# Patient Record
Sex: Female | Born: 1955
Health system: Southern US, Community
[De-identification: ages and names within clinical notes are randomized; demographics above are authoritative.]

## PROBLEM LIST (undated history)

## (undated) DIAGNOSIS — I10 Essential (primary) hypertension: Secondary | ICD-10-CM

## (undated) HISTORY — PX: BACK SURGERY: SHX140

---

## 2006-08-29 ENCOUNTER — Encounter: Admission: RE | Admit: 2006-08-29 | Discharge: 2006-08-29 | Payer: Self-pay | Admitting: Family Medicine

## 2007-08-07 ENCOUNTER — Other Ambulatory Visit: Admission: RE | Admit: 2007-08-07 | Discharge: 2007-08-07 | Payer: Self-pay | Admitting: Family Medicine

## 2008-08-30 ENCOUNTER — Other Ambulatory Visit: Admission: RE | Admit: 2008-08-30 | Discharge: 2008-08-30 | Payer: Self-pay | Admitting: Family Medicine

## 2012-11-21 ENCOUNTER — Other Ambulatory Visit (HOSPITAL_COMMUNITY)
Admission: RE | Admit: 2012-11-21 | Discharge: 2012-11-21 | Disposition: A | Discharge status: Home / Self Care | Payer: BC Managed Care – PPO | Source: Ambulatory Visit | Attending: Obstetrics and Gynecology | Admitting: Obstetrics and Gynecology

## 2012-11-21 ENCOUNTER — Other Ambulatory Visit: Payer: Self-pay | Admitting: Obstetrics and Gynecology

## 2012-11-21 DIAGNOSIS — Z01419 Encounter for gynecological examination (general) (routine) without abnormal findings: Secondary | ICD-10-CM | POA: Insufficient documentation

## 2012-11-21 DIAGNOSIS — Z1151 Encounter for screening for human papillomavirus (HPV): Secondary | ICD-10-CM | POA: Insufficient documentation

## 2015-08-05 DIAGNOSIS — E119 Type 2 diabetes mellitus without complications: Secondary | ICD-10-CM | POA: Diagnosis not present

## 2015-08-05 DIAGNOSIS — M545 Low back pain: Secondary | ICD-10-CM | POA: Diagnosis not present

## 2016-02-03 DIAGNOSIS — E785 Hyperlipidemia, unspecified: Secondary | ICD-10-CM | POA: Diagnosis not present

## 2016-02-03 DIAGNOSIS — E119 Type 2 diabetes mellitus without complications: Secondary | ICD-10-CM | POA: Diagnosis not present

## 2016-02-03 DIAGNOSIS — I10 Essential (primary) hypertension: Secondary | ICD-10-CM | POA: Diagnosis not present

## 2016-03-08 ENCOUNTER — Other Ambulatory Visit: Payer: Self-pay | Admitting: Family Medicine

## 2016-04-21 DIAGNOSIS — Z1231 Encounter for screening mammogram for malignant neoplasm of breast: Secondary | ICD-10-CM | POA: Diagnosis not present

## 2016-07-01 DIAGNOSIS — S0500XA Injury of conjunctiva and corneal abrasion without foreign body, unspecified eye, initial encounter: Secondary | ICD-10-CM | POA: Diagnosis not present

## 2016-08-24 DIAGNOSIS — M159 Polyosteoarthritis, unspecified: Secondary | ICD-10-CM | POA: Diagnosis not present

## 2016-08-24 DIAGNOSIS — E785 Hyperlipidemia, unspecified: Secondary | ICD-10-CM | POA: Diagnosis not present

## 2016-08-24 DIAGNOSIS — I10 Essential (primary) hypertension: Secondary | ICD-10-CM | POA: Diagnosis not present

## 2016-08-24 DIAGNOSIS — E119 Type 2 diabetes mellitus without complications: Secondary | ICD-10-CM | POA: Diagnosis not present

## 2017-03-21 DIAGNOSIS — Z1231 Encounter for screening mammogram for malignant neoplasm of breast: Secondary | ICD-10-CM | POA: Diagnosis not present

## 2017-04-03 ENCOUNTER — Emergency Department (HOSPITAL_COMMUNITY)
Admission: EM | Admit: 2017-04-03 | Discharge: 2017-04-03 | Disposition: A | Discharge status: Home / Self Care | Payer: BLUE CROSS/BLUE SHIELD | Attending: Emergency Medicine | Admitting: Emergency Medicine

## 2017-04-03 ENCOUNTER — Other Ambulatory Visit: Payer: Self-pay

## 2017-04-03 ENCOUNTER — Encounter (HOSPITAL_COMMUNITY): Payer: Self-pay | Admitting: Emergency Medicine

## 2017-04-03 ENCOUNTER — Emergency Department (HOSPITAL_COMMUNITY): Payer: BLUE CROSS/BLUE SHIELD

## 2017-04-03 DIAGNOSIS — Y93E1 Activity, personal bathing and showering: Secondary | ICD-10-CM | POA: Diagnosis not present

## 2017-04-03 DIAGNOSIS — S52611A Displaced fracture of right ulna styloid process, initial encounter for closed fracture: Secondary | ICD-10-CM | POA: Diagnosis not present

## 2017-04-03 DIAGNOSIS — Y92002 Bathroom of unspecified non-institutional (private) residence single-family (private) house as the place of occurrence of the external cause: Secondary | ICD-10-CM | POA: Insufficient documentation

## 2017-04-03 DIAGNOSIS — Y999 Unspecified external cause status: Secondary | ICD-10-CM | POA: Diagnosis not present

## 2017-04-03 DIAGNOSIS — I10 Essential (primary) hypertension: Secondary | ICD-10-CM | POA: Diagnosis not present

## 2017-04-03 DIAGNOSIS — T148XXA Other injury of unspecified body region, initial encounter: Secondary | ICD-10-CM | POA: Diagnosis not present

## 2017-04-03 DIAGNOSIS — W010XXA Fall on same level from slipping, tripping and stumbling without subsequent striking against object, initial encounter: Secondary | ICD-10-CM | POA: Insufficient documentation

## 2017-04-03 DIAGNOSIS — S62101A Fracture of unspecified carpal bone, right wrist, initial encounter for closed fracture: Secondary | ICD-10-CM

## 2017-04-03 DIAGNOSIS — S52511A Displaced fracture of right radial styloid process, initial encounter for closed fracture: Secondary | ICD-10-CM | POA: Diagnosis not present

## 2017-04-03 DIAGNOSIS — S52351A Displaced comminuted fracture of shaft of radius, right arm, initial encounter for closed fracture: Secondary | ICD-10-CM | POA: Diagnosis not present

## 2017-04-03 DIAGNOSIS — S299XXA Unspecified injury of thorax, initial encounter: Secondary | ICD-10-CM | POA: Diagnosis not present

## 2017-04-03 DIAGNOSIS — R079 Chest pain, unspecified: Secondary | ICD-10-CM | POA: Diagnosis not present

## 2017-04-03 DIAGNOSIS — W19XXXA Unspecified fall, initial encounter: Secondary | ICD-10-CM

## 2017-04-03 DIAGNOSIS — M546 Pain in thoracic spine: Secondary | ICD-10-CM | POA: Diagnosis not present

## 2017-04-03 DIAGNOSIS — S6991XA Unspecified injury of right wrist, hand and finger(s), initial encounter: Secondary | ICD-10-CM | POA: Diagnosis not present

## 2017-04-03 DIAGNOSIS — S52501A Unspecified fracture of the lower end of right radius, initial encounter for closed fracture: Secondary | ICD-10-CM | POA: Diagnosis not present

## 2017-04-03 HISTORY — DX: Essential (primary) hypertension: I10

## 2017-04-03 LAB — BASIC METABOLIC PANEL
Anion gap: 12 (ref 5–15)
BUN: 14 mg/dL (ref 6–20)
CALCIUM: 9.2 mg/dL (ref 8.9–10.3)
CO2: 22 mmol/L (ref 22–32)
Chloride: 104 mmol/L (ref 101–111)
Creatinine, Ser: 0.76 mg/dL (ref 0.44–1.00)
GFR calc non Af Amer: >60 mL/min (ref >60)
Glucose, Bld: 126 mg/dL — ABNORMAL HIGH (ref 65–99)
Potassium: 4.1 mmol/L (ref 3.5–5.1)
SODIUM: 138 mmol/L (ref 135–145)

## 2017-04-03 LAB — CBC
HCT: 45.3 % (ref 36.0–46.0)
Hemoglobin: 14.9 g/dL (ref 12.0–15.0)
MCH: 30.9 pg (ref 26.0–34.0)
MCHC: 32.9 g/dL (ref 30.0–36.0)
MCV: 94 fL (ref 78.0–100.0)
PLATELETS: 218 10*3/uL (ref 150–400)
RBC: 4.82 MIL/uL (ref 3.87–5.11)
RDW: 13.5 % (ref 11.5–15.5)
WBC: 13.9 10*3/uL — AB (ref 4.0–10.5)

## 2017-04-03 MED ORDER — IBUPROFEN 800 MG PO TABS: 800.0000 mg | ORAL_TABLET | Freq: Three times a day (TID) | ORAL | 0 refills | Status: AC

## 2017-04-03 MED ORDER — ORPHENADRINE CITRATE ER 100 MG PO TB12: 100.0000 mg | ORAL_TABLET | Freq: Two times a day (BID) | ORAL | 0 refills | Status: AC

## 2017-04-03 MED ORDER — IBUPROFEN 800 MG PO TABS
800.0000 mg | ORAL_TABLET | Freq: Once | ORAL | Status: AC
  Administered 2017-04-03: 800 mg via ORAL
  Filled 2017-04-03: qty 1

## 2017-04-03 NOTE — Discharge Instructions (Addendum)
1.  Keep your wrist elevated as much as possible for the next 2 days.  While well wrapped ice pack.  Do not allow your splint to get wet. 2.  Make an appointment for evaluation by the orthopedic doctor as soon as possible.

## 2017-04-03 NOTE — ED Notes (Signed)
Ortho tech paged for splint.

## 2017-04-03 NOTE — Progress Notes (Signed)
Orthopedic Tech Progress Note Patient Details:  Roma KayserBeth Avakian [birth year] [id number]  Ortho Devices Type of Ortho Device: Ace wrap, Arm sling, Volar splint, Thumb spica splint Splint Material: Plaster Ortho Device/Splint Location: rue Ortho Device/Splint Interventions: Application   Post Interventions Patient Tolerated: Well Instructions Provided: Care of device   Brinleigh Tew 04/03/2017, 1:10 PM

## 2017-04-03 NOTE — ED Provider Notes (Signed)
[facility] EMERGENCY DEPARTMENT Provider Note   CSN: [id number] Arrival date & time: 04/03/17  1030     History   Chief Complaint Chief Complaint  Patient presents with  . Fall    HPI Erica Gross is a 62 y.o. female.  HPI Patient reports she was coming out of the shower when she slipped and fell.  She landed on her right wrist.  She reports she has severe pain in the wrist.  She denies she hit her head.  She denies headache or loss of consciousness.  No neck pain.  No chest pain.  Reports her low back is somewhat more uncomfortable than baseline.  She reports she however has problems with scoliosis and low back pain anyways.  She reports that she was able to stand and walk to let the medics come in.  She denies she had severe hip or lower extremity pain. Past Medical History:  Diagnosis Date  . Hypertension     There are no active problems to display for this patient.   Past Surgical History:  Procedure Laterality Date  . BACK SURGERY       OB History   None      Home Medications    Prior to Admission medications   Medication Sig Start Date End Date Taking? Authorizing Provider  ibuprofen (ADVIL,MOTRIN) 800 MG tablet Take 1 tablet (800 mg total) by mouth 3 (three) times daily. 04/03/17   Arby Barrette, MD  orphenadrine (NORFLEX) 100 MG tablet Take 1 tablet (100 mg total) by mouth 2 (two) times daily. 04/03/17   Arby Barrette, MD    Family History No family history on file.  Social History Social History   Tobacco Use  . Smoking status: Never Smoker  . Smokeless tobacco: Never Used  Substance Use Topics  . Alcohol use: Never    Frequency: Never  . Drug use: Never     Allergies   Azithromycin and Penicillins   Review of Systems Review of Systems  10 Systems reviewed and are negative for acute change except as noted in the HPI.  Physical Exam Updated Vital Signs BP 137/75   Pulse 99   Temp 97.9 F (36.6 C) (Oral)    Resp (!) 22   Ht 5\' 7"  (1.702 m)   Wt 88 kg (194 lb)   SpO2 (!) 89%   BMI 30.38 kg/m   Physical Exam  Constitutional: She is oriented to person, place, and time. She appears well-developed and well-nourished. No distress.  HENT:  Head: Normocephalic and atraumatic.  Right Ear: External ear normal.  Left Ear: External ear normal.  Nose: Nose normal.  Mouth/Throat: Oropharynx is clear and moist.  Eyes: Pupils are equal, round, and reactive to light. Conjunctivae and EOM are normal.  Neck: Neck supple.  Cardiovascular: Normal rate and regular rhythm.  No murmur heard. Pulmonary/Chest: Effort normal and breath sounds normal. No respiratory distress.  Abdominal: Soft. There is no tenderness.  Musculoskeletal: She exhibits deformity. She exhibits no edema.  Right wrist with swelling and mild deformity.  Hand is warm and dry and neurovascularly intact.  All other extremities normal range of motion.  Neurological: She is alert and oriented to person, place, and time. No cranial nerve deficit. She exhibits normal muscle tone. Coordination normal.  Skin: Skin is warm and dry.  Psychiatric: She has a normal mood and affect.  Nursing note and vitals reviewed.    ED Treatments / Results  Labs (all labs ordered  are listed, but only abnormal results are displayed) Labs Reviewed  BASIC METABOLIC PANEL - Abnormal; Notable for the following components:      Result Value   Glucose, Bld 126 (*)    All other components within normal limits  CBC - Abnormal; Notable for the following components:   WBC 13.9 (*)    All other components within normal limits    EKG None  Radiology Dg Chest 2 View  Result Date: 04/03/2017 CLINICAL DATA:  Patient fell a home.  Pain. EXAM: CHEST - 2 VIEW COMPARISON:  None. FINDINGS: Scoliotic curvature of the thoracic spine. The heart, hila, mediastinum, lungs, and pleura are unremarkable. IMPRESSION: No active cardiopulmonary disease. Electronically Signed   By:  Gerome Samavid  Williams III M.D   On: 04/03/2017 11:41   Dg Forearm Right  Result Date: 04/03/2017 CLINICAL DATA:  Pain. EXAM: RIGHT FOREARM - 2 VIEW COMPARISON:  None. FINDINGS: There is a comminuted displaced fracture of the distal radius extending into the radiocarpal joint. There is an ulnar styloid tip fracture better seen on the wrist films. Proximal radius and ulna are intact. IMPRESSION: Distal radius and ulnar styloid tip fractures as above. Electronically Signed   By: Gerome Samavid  Williams III M.D   On: 04/03/2017 11:47   Dg Wrist Complete Right  Result Date: 04/03/2017 CLINICAL DATA:  Pain after fall EXAM: RIGHT WRIST - COMPLETE 3+ VIEW COMPARISON:  None. FINDINGS: There is a comminuted displaced fracture of the distal radius extending into the radiocarpal joint. A calcification distal to the ulna is likely due to a fracture off the ulnar styloid tip. No dislocation. IMPRESSION: Comminuted displaced fracture of the distal radius extending into the radiocarpal joint. Fracture of the distal ulnar styloid tip. Electronically Signed   By: Gerome Samavid  Williams III M.D   On: 04/03/2017 11:43    Procedures Procedures (including critical care time)  Medications Ordered in ED Medications  ibuprofen (ADVIL,MOTRIN) tablet 800 mg (800 mg Oral Given 04/03/17 1216)     Initial Impression / Assessment and Plan / ED Course  I have reviewed the triage vital signs and the nursing notes.  Pertinent labs & imaging results that were available during my care of the patient were reviewed by me and considered in my medical decision making (see chart for details).     Final Clinical Impressions(s) / ED Diagnoses   Final diagnoses:  Closed fracture of right wrist, initial encounter  Fall, initial encounter  Patient fell with slipping from her shower.  Isolated injury to her right wrist.  Patient is neurovascularly intact.  No head injury, chest injury, neck injury.  Patient was ambulatory without difficulty.  She was  splinted and was given instructions for icing and elevating.  Patient reports that the best pain medication for her ibuprofen.  Does not wish other narcotic pain control.  Patient instructed to contact orthopedics for follow-up next week.  ED Discharge Orders        Ordered    ibuprofen (ADVIL,MOTRIN) 800 MG tablet  3 times daily     04/03/17 1241    orphenadrine (NORFLEX) 100 MG tablet  2 times daily     04/03/17 1241       Arby BarrettePfeiffer, Tabbatha Bordelon, MD 04/03/17 1246

## 2017-04-03 NOTE — ED Triage Notes (Signed)
Patient from home after a fall, patient states she slipped when getting out of the shower. Denies hitting her head, denies LOC. Braced her self with arms extended, pain to right wrist. EMS reports deformity, arrives splinted, pain 3/10 without medication, PMS intact distal to injury. 20g saline lock in left hand. EMS reports patient found to be in afib RVR with rate 130 on scene, patient denies history of afib, patient denies feeling anything abnormal other than pain from the fall. EMS reports patient converted spontaneously en route to normal sinus. Patient alert and oriented at this time and in no apparent distress. NSR on monitor.

## 2017-04-06 DIAGNOSIS — M25551 Pain in right hip: Secondary | ICD-10-CM | POA: Diagnosis not present

## 2017-04-06 DIAGNOSIS — S52501A Unspecified fracture of the lower end of right radius, initial encounter for closed fracture: Secondary | ICD-10-CM | POA: Diagnosis not present

## 2017-04-06 DIAGNOSIS — M545 Low back pain: Secondary | ICD-10-CM | POA: Diagnosis not present

## 2017-04-06 DIAGNOSIS — M79641 Pain in right hand: Secondary | ICD-10-CM | POA: Diagnosis not present

## 2017-04-07 DIAGNOSIS — M25551 Pain in right hip: Secondary | ICD-10-CM | POA: Diagnosis not present

## 2017-04-07 DIAGNOSIS — M545 Low back pain: Secondary | ICD-10-CM | POA: Diagnosis not present

## 2017-04-11 DIAGNOSIS — M5136 Other intervertebral disc degeneration, lumbar region: Secondary | ICD-10-CM | POA: Diagnosis not present

## 2017-04-11 DIAGNOSIS — S329XXA Fracture of unspecified parts of lumbosacral spine and pelvis, initial encounter for closed fracture: Secondary | ICD-10-CM | POA: Diagnosis not present

## 2017-04-11 DIAGNOSIS — S32011A Stable burst fracture of first lumbar vertebra, initial encounter for closed fracture: Secondary | ICD-10-CM | POA: Diagnosis not present

## 2017-04-11 DIAGNOSIS — S3210XA Unspecified fracture of sacrum, initial encounter for closed fracture: Secondary | ICD-10-CM | POA: Diagnosis not present

## 2017-04-12 DIAGNOSIS — G8918 Other acute postprocedural pain: Secondary | ICD-10-CM | POA: Diagnosis not present

## 2017-04-12 DIAGNOSIS — Y999 Unspecified external cause status: Secondary | ICD-10-CM | POA: Diagnosis not present

## 2017-04-12 DIAGNOSIS — S52571A Other intraarticular fracture of lower end of right radius, initial encounter for closed fracture: Secondary | ICD-10-CM | POA: Diagnosis not present

## 2017-04-12 DIAGNOSIS — X58XXXA Exposure to other specified factors, initial encounter: Secondary | ICD-10-CM | POA: Diagnosis not present

## 2017-04-25 DIAGNOSIS — S52501A Unspecified fracture of the lower end of right radius, initial encounter for closed fracture: Secondary | ICD-10-CM | POA: Diagnosis not present

## 2017-04-25 DIAGNOSIS — M810 Age-related osteoporosis without current pathological fracture: Secondary | ICD-10-CM | POA: Diagnosis not present

## 2017-04-25 DIAGNOSIS — S3210XA Unspecified fracture of sacrum, initial encounter for closed fracture: Secondary | ICD-10-CM | POA: Diagnosis not present

## 2017-04-25 DIAGNOSIS — S52501D Unspecified fracture of the lower end of right radius, subsequent encounter for closed fracture with routine healing: Secondary | ICD-10-CM | POA: Diagnosis not present

## 2017-04-25 DIAGNOSIS — S32011A Stable burst fracture of first lumbar vertebra, initial encounter for closed fracture: Secondary | ICD-10-CM | POA: Diagnosis not present

## 2017-04-25 DIAGNOSIS — S329XXA Fracture of unspecified parts of lumbosacral spine and pelvis, initial encounter for closed fracture: Secondary | ICD-10-CM | POA: Diagnosis not present

## 2017-05-09 DIAGNOSIS — S52501D Unspecified fracture of the lower end of right radius, subsequent encounter for closed fracture with routine healing: Secondary | ICD-10-CM | POA: Diagnosis not present

## 2017-05-09 DIAGNOSIS — M25531 Pain in right wrist: Secondary | ICD-10-CM | POA: Diagnosis not present

## 2017-05-16 DIAGNOSIS — S3210XA Unspecified fracture of sacrum, initial encounter for closed fracture: Secondary | ICD-10-CM | POA: Diagnosis not present

## 2017-05-16 DIAGNOSIS — S32011A Stable burst fracture of first lumbar vertebra, initial encounter for closed fracture: Secondary | ICD-10-CM | POA: Diagnosis not present

## 2017-05-16 DIAGNOSIS — M5136 Other intervertebral disc degeneration, lumbar region: Secondary | ICD-10-CM | POA: Diagnosis not present

## 2017-05-16 DIAGNOSIS — S329XXA Fracture of unspecified parts of lumbosacral spine and pelvis, initial encounter for closed fracture: Secondary | ICD-10-CM | POA: Diagnosis not present

## 2017-05-17 DIAGNOSIS — M25631 Stiffness of right wrist, not elsewhere classified: Secondary | ICD-10-CM | POA: Diagnosis not present

## 2017-05-20 DIAGNOSIS — S32011D Stable burst fracture of first lumbar vertebra, subsequent encounter for fracture with routine healing: Secondary | ICD-10-CM | POA: Diagnosis not present

## 2017-05-20 DIAGNOSIS — M25631 Stiffness of right wrist, not elsewhere classified: Secondary | ICD-10-CM | POA: Diagnosis not present

## 2017-05-24 DIAGNOSIS — S32011D Stable burst fracture of first lumbar vertebra, subsequent encounter for fracture with routine healing: Secondary | ICD-10-CM | POA: Diagnosis not present

## 2017-05-24 DIAGNOSIS — M25631 Stiffness of right wrist, not elsewhere classified: Secondary | ICD-10-CM | POA: Diagnosis not present

## 2017-05-27 DIAGNOSIS — M25631 Stiffness of right wrist, not elsewhere classified: Secondary | ICD-10-CM | POA: Diagnosis not present

## 2017-05-27 DIAGNOSIS — S32011D Stable burst fracture of first lumbar vertebra, subsequent encounter for fracture with routine healing: Secondary | ICD-10-CM | POA: Diagnosis not present

## 2017-05-30 DIAGNOSIS — S32011D Stable burst fracture of first lumbar vertebra, subsequent encounter for fracture with routine healing: Secondary | ICD-10-CM | POA: Diagnosis not present

## 2017-05-30 DIAGNOSIS — M25631 Stiffness of right wrist, not elsewhere classified: Secondary | ICD-10-CM | POA: Diagnosis not present

## 2017-05-30 DIAGNOSIS — S52501D Unspecified fracture of the lower end of right radius, subsequent encounter for closed fracture with routine healing: Secondary | ICD-10-CM | POA: Diagnosis not present

## 2017-06-03 DIAGNOSIS — S32011D Stable burst fracture of first lumbar vertebra, subsequent encounter for fracture with routine healing: Secondary | ICD-10-CM | POA: Diagnosis not present

## 2017-06-03 DIAGNOSIS — M25631 Stiffness of right wrist, not elsewhere classified: Secondary | ICD-10-CM | POA: Diagnosis not present

## 2017-06-07 DIAGNOSIS — M25631 Stiffness of right wrist, not elsewhere classified: Secondary | ICD-10-CM | POA: Diagnosis not present

## 2017-06-10 DIAGNOSIS — S32011D Stable burst fracture of first lumbar vertebra, subsequent encounter for fracture with routine healing: Secondary | ICD-10-CM | POA: Diagnosis not present

## 2017-06-10 DIAGNOSIS — M25631 Stiffness of right wrist, not elsewhere classified: Secondary | ICD-10-CM | POA: Diagnosis not present

## 2017-06-13 DIAGNOSIS — M810 Age-related osteoporosis without current pathological fracture: Secondary | ICD-10-CM | POA: Diagnosis not present

## 2017-06-13 DIAGNOSIS — S32011A Stable burst fracture of first lumbar vertebra, initial encounter for closed fracture: Secondary | ICD-10-CM | POA: Diagnosis not present

## 2017-06-13 DIAGNOSIS — S3210XA Unspecified fracture of sacrum, initial encounter for closed fracture: Secondary | ICD-10-CM | POA: Diagnosis not present

## 2017-06-13 DIAGNOSIS — S52501D Unspecified fracture of the lower end of right radius, subsequent encounter for closed fracture with routine healing: Secondary | ICD-10-CM | POA: Diagnosis not present

## 2017-06-14 DIAGNOSIS — M25631 Stiffness of right wrist, not elsewhere classified: Secondary | ICD-10-CM | POA: Diagnosis not present

## 2017-06-16 DIAGNOSIS — M25631 Stiffness of right wrist, not elsewhere classified: Secondary | ICD-10-CM | POA: Diagnosis not present

## 2017-06-20 DIAGNOSIS — M25631 Stiffness of right wrist, not elsewhere classified: Secondary | ICD-10-CM | POA: Diagnosis not present

## 2017-06-24 DIAGNOSIS — M25631 Stiffness of right wrist, not elsewhere classified: Secondary | ICD-10-CM | POA: Diagnosis not present

## 2017-06-27 DIAGNOSIS — Z4789 Encounter for other orthopedic aftercare: Secondary | ICD-10-CM | POA: Diagnosis not present

## 2017-06-27 DIAGNOSIS — M25631 Stiffness of right wrist, not elsewhere classified: Secondary | ICD-10-CM | POA: Diagnosis not present

## 2017-07-01 DIAGNOSIS — S52501D Unspecified fracture of the lower end of right radius, subsequent encounter for closed fracture with routine healing: Secondary | ICD-10-CM | POA: Diagnosis not present

## 2017-07-05 DIAGNOSIS — M25631 Stiffness of right wrist, not elsewhere classified: Secondary | ICD-10-CM | POA: Diagnosis not present

## 2017-07-08 DIAGNOSIS — M25631 Stiffness of right wrist, not elsewhere classified: Secondary | ICD-10-CM | POA: Diagnosis not present

## 2017-07-13 DIAGNOSIS — M25631 Stiffness of right wrist, not elsewhere classified: Secondary | ICD-10-CM | POA: Diagnosis not present

## 2017-07-15 DIAGNOSIS — M25631 Stiffness of right wrist, not elsewhere classified: Secondary | ICD-10-CM | POA: Diagnosis not present

## 2017-07-19 DIAGNOSIS — I10 Essential (primary) hypertension: Secondary | ICD-10-CM | POA: Diagnosis not present

## 2017-07-19 DIAGNOSIS — E785 Hyperlipidemia, unspecified: Secondary | ICD-10-CM | POA: Diagnosis not present

## 2017-07-19 DIAGNOSIS — E119 Type 2 diabetes mellitus without complications: Secondary | ICD-10-CM | POA: Diagnosis not present

## 2017-07-19 DIAGNOSIS — I7090 Unspecified atherosclerosis: Secondary | ICD-10-CM | POA: Diagnosis not present

## 2017-07-20 DIAGNOSIS — M25631 Stiffness of right wrist, not elsewhere classified: Secondary | ICD-10-CM | POA: Diagnosis not present

## 2017-07-22 DIAGNOSIS — M25531 Pain in right wrist: Secondary | ICD-10-CM | POA: Diagnosis not present

## 2017-07-25 DIAGNOSIS — M25531 Pain in right wrist: Secondary | ICD-10-CM | POA: Diagnosis not present

## 2017-07-25 DIAGNOSIS — M25631 Stiffness of right wrist, not elsewhere classified: Secondary | ICD-10-CM | POA: Diagnosis not present

## 2017-07-29 DIAGNOSIS — M25631 Stiffness of right wrist, not elsewhere classified: Secondary | ICD-10-CM | POA: Diagnosis not present

## 2017-08-01 DIAGNOSIS — M25631 Stiffness of right wrist, not elsewhere classified: Secondary | ICD-10-CM | POA: Diagnosis not present

## 2017-08-08 DIAGNOSIS — M25631 Stiffness of right wrist, not elsewhere classified: Secondary | ICD-10-CM | POA: Diagnosis not present

## 2017-08-10 DIAGNOSIS — M25631 Stiffness of right wrist, not elsewhere classified: Secondary | ICD-10-CM | POA: Diagnosis not present

## 2017-08-16 DIAGNOSIS — M25631 Stiffness of right wrist, not elsewhere classified: Secondary | ICD-10-CM | POA: Diagnosis not present

## 2017-08-19 DIAGNOSIS — M25631 Stiffness of right wrist, not elsewhere classified: Secondary | ICD-10-CM | POA: Diagnosis not present

## 2017-08-23 DIAGNOSIS — M25631 Stiffness of right wrist, not elsewhere classified: Secondary | ICD-10-CM | POA: Diagnosis not present

## 2017-08-26 DIAGNOSIS — M25631 Stiffness of right wrist, not elsewhere classified: Secondary | ICD-10-CM | POA: Diagnosis not present

## 2017-08-29 DIAGNOSIS — Z4789 Encounter for other orthopedic aftercare: Secondary | ICD-10-CM | POA: Diagnosis not present

## 2017-08-29 DIAGNOSIS — S52531D Colles' fracture of right radius, subsequent encounter for closed fracture with routine healing: Secondary | ICD-10-CM | POA: Diagnosis not present

## 2017-08-29 DIAGNOSIS — M25631 Stiffness of right wrist, not elsewhere classified: Secondary | ICD-10-CM | POA: Diagnosis not present

## 2017-08-29 DIAGNOSIS — M25531 Pain in right wrist: Secondary | ICD-10-CM | POA: Diagnosis not present

## 2017-09-02 DIAGNOSIS — M25631 Stiffness of right wrist, not elsewhere classified: Secondary | ICD-10-CM | POA: Diagnosis not present

## 2017-09-02 DIAGNOSIS — M25531 Pain in right wrist: Secondary | ICD-10-CM | POA: Diagnosis not present

## 2017-09-06 DIAGNOSIS — M25631 Stiffness of right wrist, not elsewhere classified: Secondary | ICD-10-CM | POA: Diagnosis not present

## 2017-09-09 DIAGNOSIS — M25531 Pain in right wrist: Secondary | ICD-10-CM | POA: Diagnosis not present

## 2017-09-13 DIAGNOSIS — M25631 Stiffness of right wrist, not elsewhere classified: Secondary | ICD-10-CM | POA: Diagnosis not present

## 2017-09-13 DIAGNOSIS — M25531 Pain in right wrist: Secondary | ICD-10-CM | POA: Diagnosis not present

## 2017-09-16 DIAGNOSIS — M25631 Stiffness of right wrist, not elsewhere classified: Secondary | ICD-10-CM | POA: Diagnosis not present

## 2017-09-21 DIAGNOSIS — M25631 Stiffness of right wrist, not elsewhere classified: Secondary | ICD-10-CM | POA: Diagnosis not present

## 2017-09-28 DIAGNOSIS — M25631 Stiffness of right wrist, not elsewhere classified: Secondary | ICD-10-CM | POA: Diagnosis not present

## 2017-10-05 DIAGNOSIS — Z1211 Encounter for screening for malignant neoplasm of colon: Secondary | ICD-10-CM | POA: Diagnosis not present

## 2017-10-12 DIAGNOSIS — M25631 Stiffness of right wrist, not elsewhere classified: Secondary | ICD-10-CM | POA: Diagnosis not present

## 2017-12-01 DIAGNOSIS — S52531D Colles' fracture of right radius, subsequent encounter for closed fracture with routine healing: Secondary | ICD-10-CM | POA: Diagnosis not present

## 2018-01-20 DIAGNOSIS — E119 Type 2 diabetes mellitus without complications: Secondary | ICD-10-CM | POA: Diagnosis not present

## 2018-01-20 DIAGNOSIS — E785 Hyperlipidemia, unspecified: Secondary | ICD-10-CM | POA: Diagnosis not present

## 2018-01-20 DIAGNOSIS — I10 Essential (primary) hypertension: Secondary | ICD-10-CM | POA: Diagnosis not present

## 2018-01-20 DIAGNOSIS — M159 Polyosteoarthritis, unspecified: Secondary | ICD-10-CM | POA: Diagnosis not present

## 2018-07-25 DIAGNOSIS — I10 Essential (primary) hypertension: Secondary | ICD-10-CM | POA: Diagnosis not present

## 2018-07-25 DIAGNOSIS — E785 Hyperlipidemia, unspecified: Secondary | ICD-10-CM | POA: Diagnosis not present

## 2018-07-25 DIAGNOSIS — M159 Polyosteoarthritis, unspecified: Secondary | ICD-10-CM | POA: Diagnosis not present

## 2018-07-25 DIAGNOSIS — E119 Type 2 diabetes mellitus without complications: Secondary | ICD-10-CM | POA: Diagnosis not present

## 2018-08-07 IMAGING — DX DG WRIST COMPLETE 3+V*R*
4 series · 4 of 4 positions shown · non-contrast
Comparison: None.

CLINICAL DATA: Pain after fall

EXAM:
RIGHT WRIST - COMPLETE 3+ VIEW

[wrist pa]
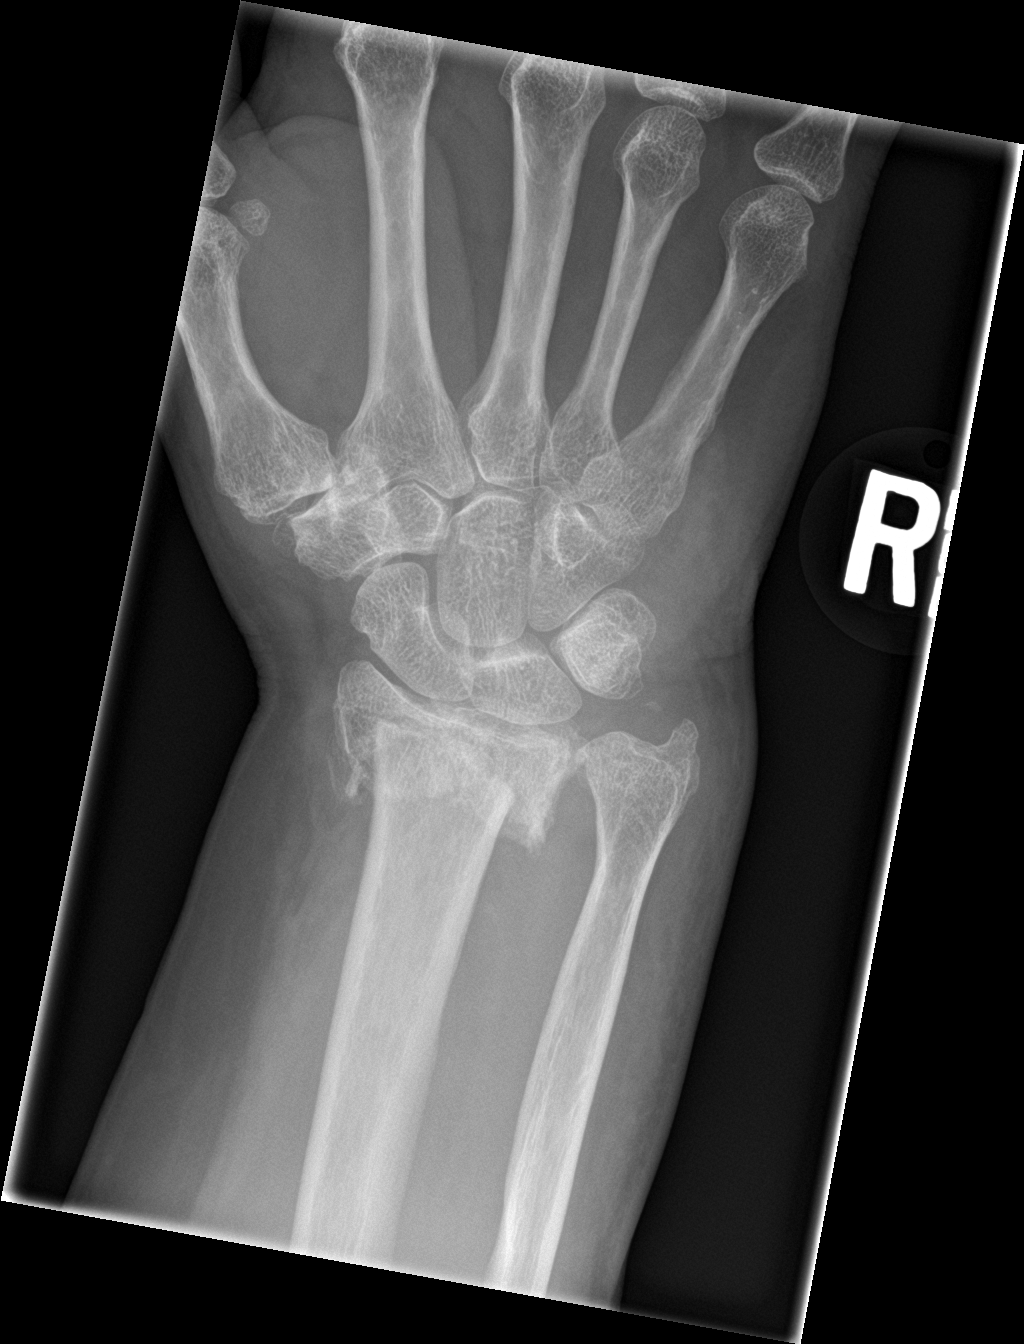

[wrist obl]
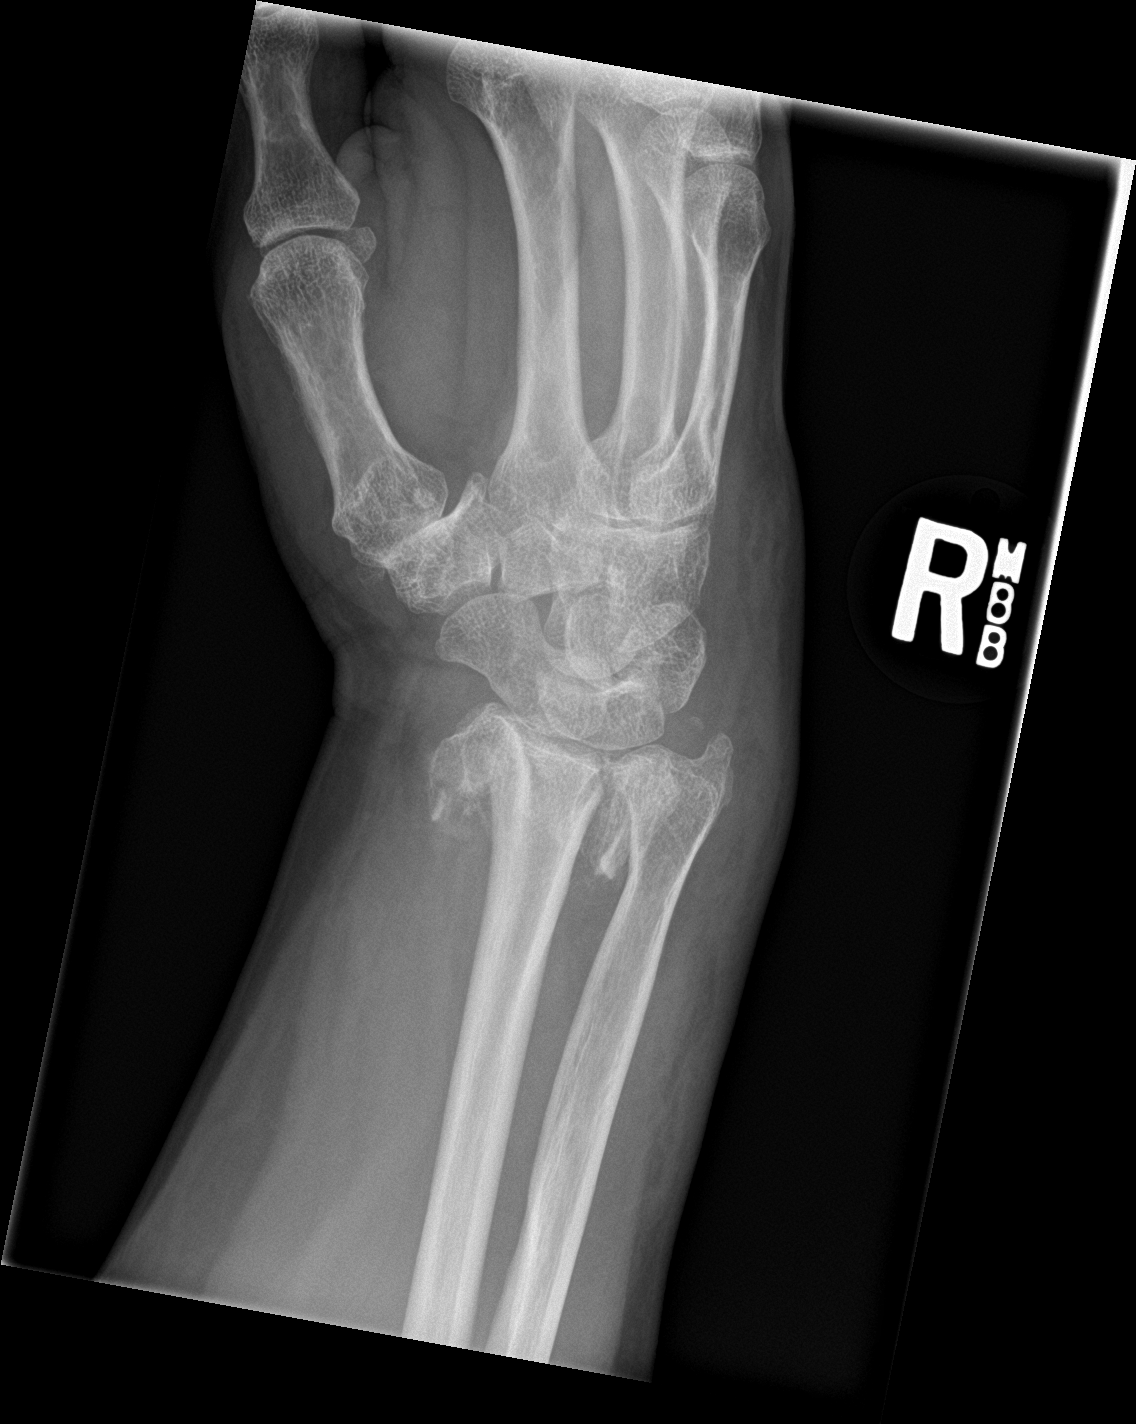

[wrist lat]
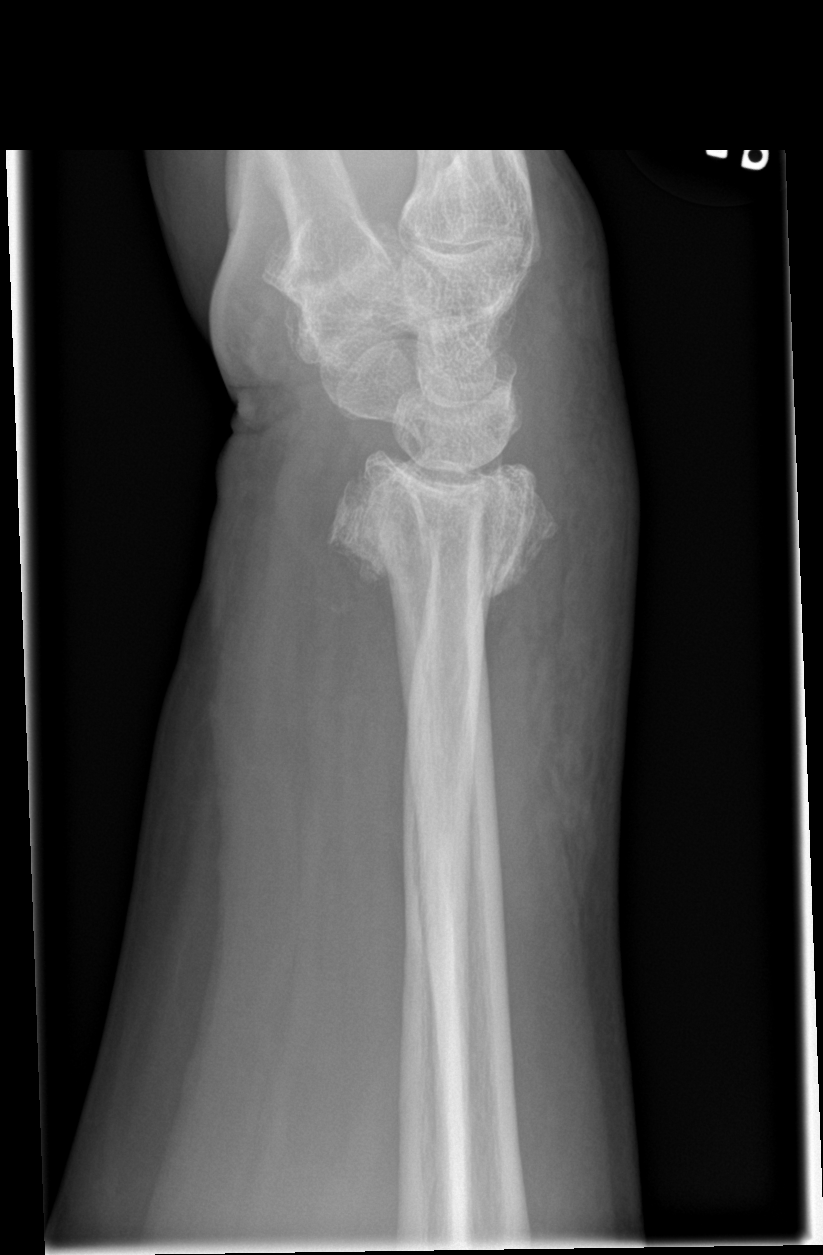

[wrist navicular]
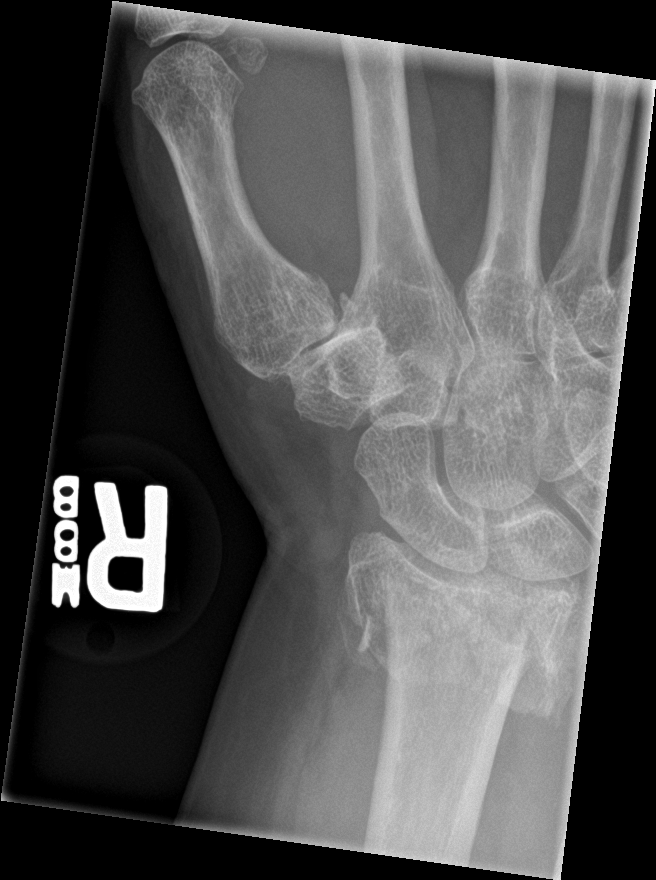

[4 of 4 positions shown; findings below may reference images not displayed]

FINDINGS: There is a comminuted displaced fracture of the distal radius
extending into the radiocarpal joint. A calcification distal to the
ulna is likely due to a fracture off the ulnar styloid tip. No
dislocation.
IMPRESSION: Comminuted displaced fracture of the distal radius extending into
the radiocarpal joint. Fracture of the distal ulnar styloid tip.

## 2018-11-28 DIAGNOSIS — Z1231 Encounter for screening mammogram for malignant neoplasm of breast: Secondary | ICD-10-CM | POA: Diagnosis not present

## 2019-01-26 DIAGNOSIS — E785 Hyperlipidemia, unspecified: Secondary | ICD-10-CM | POA: Diagnosis not present

## 2019-01-26 DIAGNOSIS — I7090 Unspecified atherosclerosis: Secondary | ICD-10-CM | POA: Diagnosis not present

## 2019-01-26 DIAGNOSIS — E119 Type 2 diabetes mellitus without complications: Secondary | ICD-10-CM | POA: Diagnosis not present

## 2019-01-26 DIAGNOSIS — Z23 Encounter for immunization: Secondary | ICD-10-CM | POA: Diagnosis not present

## 2019-01-26 DIAGNOSIS — I10 Essential (primary) hypertension: Secondary | ICD-10-CM | POA: Diagnosis not present

## 2019-01-29 DIAGNOSIS — E119 Type 2 diabetes mellitus without complications: Secondary | ICD-10-CM | POA: Diagnosis not present

## 2019-03-27 DIAGNOSIS — Z23 Encounter for immunization: Secondary | ICD-10-CM | POA: Diagnosis not present

## 2019-09-11 DIAGNOSIS — E785 Hyperlipidemia, unspecified: Secondary | ICD-10-CM | POA: Diagnosis not present

## 2019-09-11 DIAGNOSIS — E119 Type 2 diabetes mellitus without complications: Secondary | ICD-10-CM | POA: Diagnosis not present

## 2019-09-11 DIAGNOSIS — Z0000 Encounter for general adult medical examination without abnormal findings: Secondary | ICD-10-CM | POA: Diagnosis not present

## 2019-09-11 DIAGNOSIS — E559 Vitamin D deficiency, unspecified: Secondary | ICD-10-CM | POA: Diagnosis not present

## 2019-09-11 DIAGNOSIS — I10 Essential (primary) hypertension: Secondary | ICD-10-CM | POA: Diagnosis not present

## 2019-11-28 DIAGNOSIS — Z1231 Encounter for screening mammogram for malignant neoplasm of breast: Secondary | ICD-10-CM | POA: Diagnosis not present

## 2020-01-16 DIAGNOSIS — Z20828 Contact with and (suspected) exposure to other viral communicable diseases: Secondary | ICD-10-CM | POA: Diagnosis not present

## 2020-01-17 DIAGNOSIS — Z20828 Contact with and (suspected) exposure to other viral communicable diseases: Secondary | ICD-10-CM | POA: Diagnosis not present

## 2020-04-21 DIAGNOSIS — I10 Essential (primary) hypertension: Secondary | ICD-10-CM | POA: Diagnosis not present

## 2020-04-21 DIAGNOSIS — E785 Hyperlipidemia, unspecified: Secondary | ICD-10-CM | POA: Diagnosis not present

## 2020-04-21 DIAGNOSIS — E119 Type 2 diabetes mellitus without complications: Secondary | ICD-10-CM | POA: Diagnosis not present

## 2020-05-05 DIAGNOSIS — E119 Type 2 diabetes mellitus without complications: Secondary | ICD-10-CM | POA: Diagnosis not present

## 2020-05-05 DIAGNOSIS — Z1211 Encounter for screening for malignant neoplasm of colon: Secondary | ICD-10-CM | POA: Diagnosis not present
